# Patient Record
Sex: Male | Born: 1955 | Race: White | Hispanic: No | Marital: Married | Smoking: Former smoker
Health system: Southern US, Community
[De-identification: ages and names within clinical notes are randomized; demographics above are authoritative.]

## PROBLEM LIST (undated history)

## (undated) DIAGNOSIS — C801 Malignant (primary) neoplasm, unspecified: Secondary | ICD-10-CM

---

## 2018-02-25 ENCOUNTER — Encounter (HOSPITAL_COMMUNITY): Payer: Self-pay

## 2018-02-25 ENCOUNTER — Emergency Department (HOSPITAL_COMMUNITY): Payer: Managed Care, Other (non HMO)

## 2018-02-25 ENCOUNTER — Emergency Department (HOSPITAL_COMMUNITY)
Admission: EM | Admit: 2018-02-25 | Discharge: 2018-02-25 | Disposition: A | Discharge status: Home / Self Care | Payer: Managed Care, Other (non HMO) | Attending: Emergency Medicine | Admitting: Emergency Medicine

## 2018-02-25 DIAGNOSIS — R079 Chest pain, unspecified: Secondary | ICD-10-CM | POA: Diagnosis present

## 2018-02-25 DIAGNOSIS — R0789 Other chest pain: Secondary | ICD-10-CM | POA: Diagnosis not present

## 2018-02-25 DIAGNOSIS — Z87891 Personal history of nicotine dependence: Secondary | ICD-10-CM | POA: Diagnosis not present

## 2018-02-25 DIAGNOSIS — R918 Other nonspecific abnormal finding of lung field: Secondary | ICD-10-CM | POA: Diagnosis not present

## 2018-02-25 LAB — D-DIMER, QUANTITATIVE: D-Dimer, Quant: 0.31 ug/mL-FEU (ref 0.00–0.50)

## 2018-02-25 LAB — CBC
HCT: 40.8 % (ref 39.0–52.0)
Hemoglobin: 13.4 g/dL (ref 13.0–17.0)
MCH: 29.1 pg (ref 26.0–34.0)
MCHC: 32.8 g/dL (ref 30.0–36.0)
MCV: 88.5 fL (ref 80.0–100.0)
Platelets: 235 10*3/uL (ref 150–400)
RBC: 4.61 MIL/uL (ref 4.22–5.81)
RDW: 12.8 % (ref 11.5–15.5)
WBC: 13 10*3/uL — ABNORMAL HIGH (ref 4.0–10.5)
nRBC: 0 % (ref 0.0–0.2)

## 2018-02-25 LAB — BASIC METABOLIC PANEL
Anion gap: 9 (ref 5–15)
BUN: 20 mg/dL (ref 8–23)
CO2: 22 mmol/L (ref 22–32)
Calcium: 8.9 mg/dL (ref 8.9–10.3)
Chloride: 110 mmol/L (ref 98–111)
Creatinine, Ser: 1.15 mg/dL (ref 0.61–1.24)
GFR calc Af Amer: >60 mL/min (ref >60)
GFR calc non Af Amer: >60 mL/min (ref >60)
Glucose, Bld: 136 mg/dL — ABNORMAL HIGH (ref 70–99)
Potassium: 3.4 mmol/L — ABNORMAL LOW (ref 3.5–5.1)
SODIUM: 141 mmol/L (ref 135–145)

## 2018-02-25 LAB — I-STAT TROPONIN, ED
Troponin i, poc: 0 ng/mL (ref 0.00–0.08)
Troponin i, poc: 0.01 ng/mL (ref 0.00–0.08)

## 2018-02-25 MED ORDER — NITROGLYCERIN 2 % TD OINT
1.0000 [in_us] | TOPICAL_OINTMENT | Freq: Once | TRANSDERMAL | Status: AC
  Administered 2018-02-25: 1 [in_us] via TOPICAL
  Filled 2018-02-25: qty 1

## 2018-02-25 MED ORDER — FENTANYL CITRATE (PF) 100 MCG/2ML IJ SOLN
50.0000 ug | Freq: Once | INTRAMUSCULAR | Status: AC
  Administered 2018-02-25: 50 ug via INTRAVENOUS
  Filled 2018-02-25: qty 2

## 2018-02-25 MED ORDER — IOPAMIDOL (ISOVUE-370) INJECTION 76%
INTRAVENOUS | Status: AC
  Filled 2018-02-25: qty 100

## 2018-02-25 MED ORDER — AMOXICILLIN-POT CLAVULANATE 875-125 MG PO TABS: 1.0000 | ORAL_TABLET | Freq: Two times a day (BID) | ORAL | 0 refills | Status: DC

## 2018-02-25 MED ORDER — IOPAMIDOL (ISOVUE-370) INJECTION 76%
100.0000 mL | Freq: Once | INTRAVENOUS | Status: AC | PRN
  Administered 2018-02-25: 100 mL via INTRAVENOUS

## 2018-02-25 MED ORDER — ASPIRIN 81 MG PO CHEW: 324.0000 mg | CHEWABLE_TABLET | Freq: Once | ORAL | Status: DC

## 2018-02-25 NOTE — ED Provider Notes (Signed)
8:54 AM Care transferred to me.  The patient CT scan does not show any evidence of PE or dissection.  He is feeling better.  CT scan does show evidence of granulomatous disease.  Patient states that years ago he had significant mold exposure and was told that he would always have abnormalities on x-rays or imaging of his lungs.  However I did discuss the masslike opacity seen.  While I discussed this could be benign, I also discussed the importance of getting the follow-up PET-CT scan in 4-6 weeks by his PCP.  He recently has had a respiratory illness and this could have an inflammatory/infectious component so he will be placed on Augmentin.  We discussed possible risks/side effects of antibiotics.  He otherwise appears stable for discharge home and we encouraged that he can return anytime if symptoms were to recur or worsen.  Results for orders placed or performed during the hospital encounter of 89/38/10  Basic metabolic panel  Result Value Ref Range   Sodium 141 135 - 145 mmol/L   Potassium 3.4 (L) 3.5 - 5.1 mmol/L   Chloride 110 98 - 111 mmol/L   CO2 22 22 - 32 mmol/L   Glucose, Bld 136 (H) 70 - 99 mg/dL   BUN 20 8 - 23 mg/dL   Creatinine, Ser 1.15 0.61 - 1.24 mg/dL   Calcium 8.9 8.9 - 10.3 mg/dL   GFR calc non Af Amer >60 >60 mL/min   GFR calc Af Amer >60 >60 mL/min   Anion gap 9 5 - 15  CBC  Result Value Ref Range   WBC 13.0 (H) 4.0 - 10.5 K/uL   RBC 4.61 4.22 - 5.81 MIL/uL   Hemoglobin 13.4 13.0 - 17.0 g/dL   HCT 40.8 39.0 - 52.0 %   MCV 88.5 80.0 - 100.0 fL   MCH 29.1 26.0 - 34.0 pg   MCHC 32.8 30.0 - 36.0 g/dL   RDW 12.8 11.5 - 15.5 %   Platelets 235 150 - 400 K/uL   nRBC 0.0 0.0 - 0.2 %  D-dimer, quantitative (not at [facility])  Result Value Ref Range   D-Dimer, Quant 0.31 0.00 - 0.50 ug/mL-FEU  I-stat troponin, ED  Result Value Ref Range   Troponin i, poc 0.00 0.00 - 0.08 ng/mL   Comment 3          I-Stat Troponin, ED (not at [facility])  Result Value Ref Range   Troponin i, poc  0.01 0.00 - 0.08 ng/mL   Comment 3           Dg Chest 2 View  Result Date: 02/25/2018 CLINICAL DATA:  62 y/o  M; chest pain radiating to the back. EXAM: CHEST - 2 VIEW COMPARISON:  None. FINDINGS: The heart size and mediastinal contours are within normal limits. Calcified granulomas in the lung apices. Pulmonary venous hypertension. No consolidation, effusion, or pneumothorax. No acute osseous abnormality is evident. IMPRESSION: Pulmonary venous hypertension.  No focal consolidation. Electronically Signed   By: Kristine Garbe M.D.   On: 02/25/2018 04:23   Ct Angio Chest/abd/pel For Dissection W And/or Wo Contrast  Result Date: 02/25/2018 CLINICAL DATA:  61 year old male with chest pain beginning earlier this morning. EXAM: CT ANGIOGRAPHY CHEST, ABDOMEN AND PELVIS TECHNIQUE: Multidetector CT imaging through the chest, abdomen and pelvis was performed using the standard protocol during bolus administration of intravenous contrast. Multiplanar reconstructed images and MIPs were obtained and reviewed to evaluate the vascular anatomy. CONTRAST:  179mL ISOVUE-370 IOPAMIDOL (ISOVUE-370) INJECTION  76% COMPARISON:  Chest x-ray obtained earlier today FINDINGS: CTA CHEST FINDINGS Cardiovascular: Excellent opacification of the thoracic aorta and branch vessels. Two vessel aortic arch anatomy. The right brachiocephalic and left common carotid artery share a common origin. No evidence of aneurysm, dissection or significant atherosclerotic plaque. The main pulmonary artery is normal in size and also well opacified. No evidence of pulmonary embolus. The heart is normal in size. No pericardial effusion. Mediastinum/Nodes: Positive for calcified and noncalcified mediastinal lymphadenopathy. An index right paratracheal node measures 1.7 cm in short axis. An index low right paratracheal lymph node measures 1.7 cm in short axis. Numerous calcified lymph nodes are present in both hilar nodal stations. Thoracic  esophagus is unremarkable. Lungs/Pleura: Macrolobulated soft tissue mass in the posterior aspect of the right lower lobe contains punctate internal calcifications and measures approximately 4.4 x 3.1 x 4.0 cm. There is adjacent patchy airspace opacity which may represent atelectasis, pneumonia or potentially postobstructive changes. Patchy airspace opacity also present within the posterior left lower lobe. There is an area of masslike consolidation measuring approximately 2.2 by 1.8 cm. Additional punctate calcified granulomas also present within the region. Numerous scattered bilateral calcified pulmonary nodules consistent with old granulomatous disease. Musculoskeletal: No acute fracture or aggressive appearing lytic or blastic osseous lesion. Review of the MIP images confirms the above findings. CTA ABDOMEN AND PELVIS FINDINGS VASCULAR Aorta: Normal caliber aorta without aneurysm, dissection, vasculitis or significant stenosis. Celiac: Patent without evidence of aneurysm, dissection, vasculitis or significant stenosis. SMA: Patent without evidence of aneurysm, dissection, vasculitis or significant stenosis. Renals: Both renal arteries are patent without evidence of aneurysm, dissection, vasculitis, fibromuscular dysplasia or significant stenosis. IMA: Patent without evidence of aneurysm, dissection, vasculitis or significant stenosis. Inflow: Patent without evidence of aneurysm, dissection, vasculitis or significant stenosis. Veins: No obvious venous abnormality within the limitations of this arterial phase study. Review of the MIP images confirms the above findings. NON-VASCULAR Hepatobiliary: Normal hepatic contour and morphology. No discrete hepatic lesions. Normal appearance of the gallbladder. No intra or extrahepatic biliary ductal dilatation. Pancreas: Unremarkable. No pancreatic ductal dilatation or surrounding inflammatory changes. Spleen: Punctate calcification consistent with sequelae of old  granulomatous disease. Adrenals/Urinary Tract: Adrenal glands are unremarkable. Kidneys are normal, without renal calculi, focal lesion, or hydronephrosis. Bladder is unremarkable. Stomach/Bowel: No evidence of obstruction or focal bowel wall thickening. Normal appendix in the right lower quadrant. The terminal ileum is unremarkable. Lymphatic: No suspicious lymphadenopathy in the abdomen or pelvis. Reproductive: Prostate is unremarkable. Other: No abdominal wall hernia or abnormality. No abdominopelvic ascites. Musculoskeletal: No acute or significant osseous findings. Review of the MIP images confirms the above findings. IMPRESSION: CTA CHEST 1. Negative for acute aortic dissection, central pulmonary embolus or other acute vascular abnormality. 2. Atypical and nonspecific bilateral lower lobe masslike opacities with internal dystrophic calcifications, mediastinal adenopathy and clear evidence of old granulomatous disease involving the lungs, spleen and bilateral hilar lymph nodes. Overall, the process is favored to be benign in nature and represent either advanced sequelae of old granulomatous disease, cryptogenic organizing pneumonia, atypical sarcoidosis or some combination of the above. Primary bronchogenic malignancy is considered less likely. Additionally, there is some superimposed patchy airspace opacification in both lower lobes in addition to the areas of masslike consolidation which may represent a superimposed acute infectious/inflammatory process such as pneumonia. Consider further evaluation with PET-CT in 4-6 weeks following an appropriate course of antibiotic therapy. CTA ABD/PELVIS 1. No acute vascular abnormality within the abdomen or pelvis. 2. Sequelae of old  granulomatous disease in the spleen. 3. Otherwise, unremarkable CTA of the abdomen and pelvis. Electronically Signed   By: Jacqulynn Cadet M.D.   On: 02/25/2018 08:37      Sherwood Gambler, MD 02/25/18 [phone]

## 2018-02-25 NOTE — ED Provider Notes (Signed)
[place] EMERGENCY DEPARTMENT Provider Note   CSN: [id number] Arrival date & time: 02/25/18  0334     History   Chief Complaint Chief Complaint  Patient presents with  . Chest Pain    HPI Thomas Wiley is a 62 y.o. male.  HPI  This is a 62 year old male who presents with chest pain.  Onset of chest pain 1 hour prior to arrival.  Patient states that he was at work when he developed sharp anterior chest pain that lasted for a few seconds.  He states that it starts out as an 8 and then settles into a 4 on the pain scale.  He states that it settles into pressure-like pain.  Currently he is endorsing 4 out of 10 pain.  Denies any coughs, shortness of breath, fever.  Denies any history of coronary artery disease.  He is a former smoker.  Denies any history of hypertension, hyperlipidemia, diabetes.  Nothing seems to make the pain better or worse.  History reviewed. No pertinent past medical history.  There are no active problems to display for this patient.   History reviewed. No pertinent surgical history.      Home Medications    Prior to Admission medications   Not on File    Family History History reviewed. No pertinent family history.  Social History Social History   Tobacco Use  . Smoking status: Former Research scientist (life sciences)  . Smokeless tobacco: Never Used  Substance Use Topics  . Alcohol use: Yes    Comment: occ  . Drug use: Never     Allergies   Patient has no allergy information on record.   Review of Systems Review of Systems  Constitutional: Negative for fever.  Respiratory: Negative for cough and shortness of breath.   Cardiovascular: Positive for chest pain. Negative for leg swelling.  Gastrointestinal: Negative for abdominal pain, nausea and vomiting.  Genitourinary: Negative for dysuria.  All other systems reviewed and are negative.    Physical Exam Updated Vital Signs BP 105/75   Pulse (!) 58   Temp 97.7 F (36.5 C) (Oral)    Resp 11   Ht 1.676 m (5\' 6" )   Wt 81.6 kg   SpO2 97%   BMI 29.05 kg/m   Physical Exam  Constitutional: He is oriented to person, place, and time. He appears well-developed and well-nourished. No distress.  HENT:  Head: Normocephalic and atraumatic.  Neck: Neck supple.  Cardiovascular: Normal rate, regular rhythm and normal heart sounds.  No murmur heard. Pulmonary/Chest: Effort normal and breath sounds normal. No respiratory distress. He has no wheezes.  Abdominal: Soft. Bowel sounds are normal. There is no tenderness. There is no rebound.  Musculoskeletal: He exhibits no edema.       Right lower leg: He exhibits no edema.       Left lower leg: He exhibits no edema.  Lymphadenopathy:    He has no cervical adenopathy.  Neurological: He is alert and oriented to person, place, and time.  Skin: Skin is warm and dry.  Psychiatric: He has a normal mood and affect.  Nursing note and vitals reviewed.    ED Treatments / Results  Labs (all labs ordered are listed, but only abnormal results are displayed) Labs Reviewed  BASIC METABOLIC PANEL - Abnormal; Notable for the following components:      Result Value   Potassium 3.4 (*)    Glucose, Bld 136 (*)    All other components within normal limits  CBC - Abnormal; Notable for the following components:   WBC 13.0 (*)    All other components within normal limits  D-DIMER, QUANTITATIVE (NOT AT [facility])  I-STAT TROPONIN, ED  I-STAT TROPONIN, ED    EKG EKG Interpretation  Date/Time:  Wednesday February 25 2018 03:35:34 EST Ventricular Rate:  47 PR Interval:    QRS Duration: 103 QT Interval:  421 QTC Calculation: 373 R Axis:   88 Text Interpretation:  Sinus bradycardia Borderline right axis deviation No prior for comparison Confirmed by Thayer Jew [id number]) on 02/25/2018 3:41:47 AM Also confirmed by Thayer Jew [phone]), editor Hattie Perch ([id number])  on 02/25/2018 6:45:49 AM   Radiology Dg Chest 2 View  Result  Date: 02/25/2018 CLINICAL DATA:  62 y/o  M; chest pain radiating to the back. EXAM: CHEST - 2 VIEW COMPARISON:  None. FINDINGS: The heart size and mediastinal contours are within normal limits. Calcified granulomas in the lung apices. Pulmonary venous hypertension. No consolidation, effusion, or pneumothorax. No acute osseous abnormality is evident. IMPRESSION: Pulmonary venous hypertension.  No focal consolidation. Electronically Signed   By: Kristine Garbe M.D.   On: 02/25/2018 04:23    Procedures Procedures (including critical care time)  Medications Ordered in ED Medications  aspirin chewable tablet 324 mg (324 mg Oral Not Given 02/25/18 0350)  nitroGLYCERIN (NITROGLYN) 2 % ointment 1 inch (1 inch Topical Given 02/25/18 0425)  fentaNYL (SUBLIMAZE) injection 50 mcg (50 mcg Intravenous Given 02/25/18 0512)     Initial Impression / Assessment and Plan / ED Course  I have reviewed the triage vital signs and the nursing notes.  Pertinent labs & imaging results that were available during my care of the patient were reviewed by me and considered in my medical decision making (see chart for details).  Clinical Course as of Feb 25 722  Wed Feb 25, 2018  0350 Patient initially did not endorse any radiation to the back.  However, on recheck he is much more comfortable after pain medication but states that he still has some discomfort that radiates to his back.  Will obtain CT scan to rule out dissection.   [CH]    Clinical Course User Index [CH] Jaran Sainz, Barbette Hair, MD    Patient presents with chest pain.  He is overall nontoxic-appearing and vital signs are reassuring.  Pain description is fairly atypical.  He denies exertional symptoms or pleuritic symptoms.  He denies radiation; although on review of nursing notes he indicated radiation to them.  Initial work-up with troponin x2, d-dimer is negative.  He has no evidence of acute ischemia on EKG.  Chest x-ray shows no evidence of  pneumothorax or pneumonia.  He does not have mediastinal widening.  However, with persistent radiation of pain to the back, will obtain a CT scan to rule out dissection.  Final Clinical Impressions(s) / ED Diagnoses   Final diagnoses:  Atypical chest pain    ED Discharge Orders    None       Joeanna Howdyshell, Barbette Hair, MD 02/25/18 [phone]

## 2018-02-25 NOTE — Discharge Instructions (Addendum)
You were seen today for chest pain.  Your work-up is largely reassuring.  You need to follow-up with cardiology for stress testing.  If you have any new or worsening symptoms you should be reevaluated.  Your CT scan shows areas of granulomas that appear old. There is also a mass-like opacities that are not clearly defined. Pneumonia could be contributing. However it is very important to get a PET-CT scan in about 4-6 weeks to ensure these improve or are not worsening.

## 2018-02-25 NOTE — ED Triage Notes (Signed)
Pt was at work about an hour ago and started to have centeralized chest pain that radiates to his back. Pt was given 324 asa and fentlyn. Pt was not given nitro due to pt blood pressure being so low 80/60 and when he layed down bp went up to 100/60. Pt has hx of low heart rate.

## 2019-01-26 IMAGING — DX DG CHEST 2V
2 series · 2 of 2 positions shown · non-contrast
Comparison: None.

CLINICAL DATA: 62 y/o  M; chest pain radiating to the back.

EXAM:
CHEST - 2 VIEW

[chest pa]
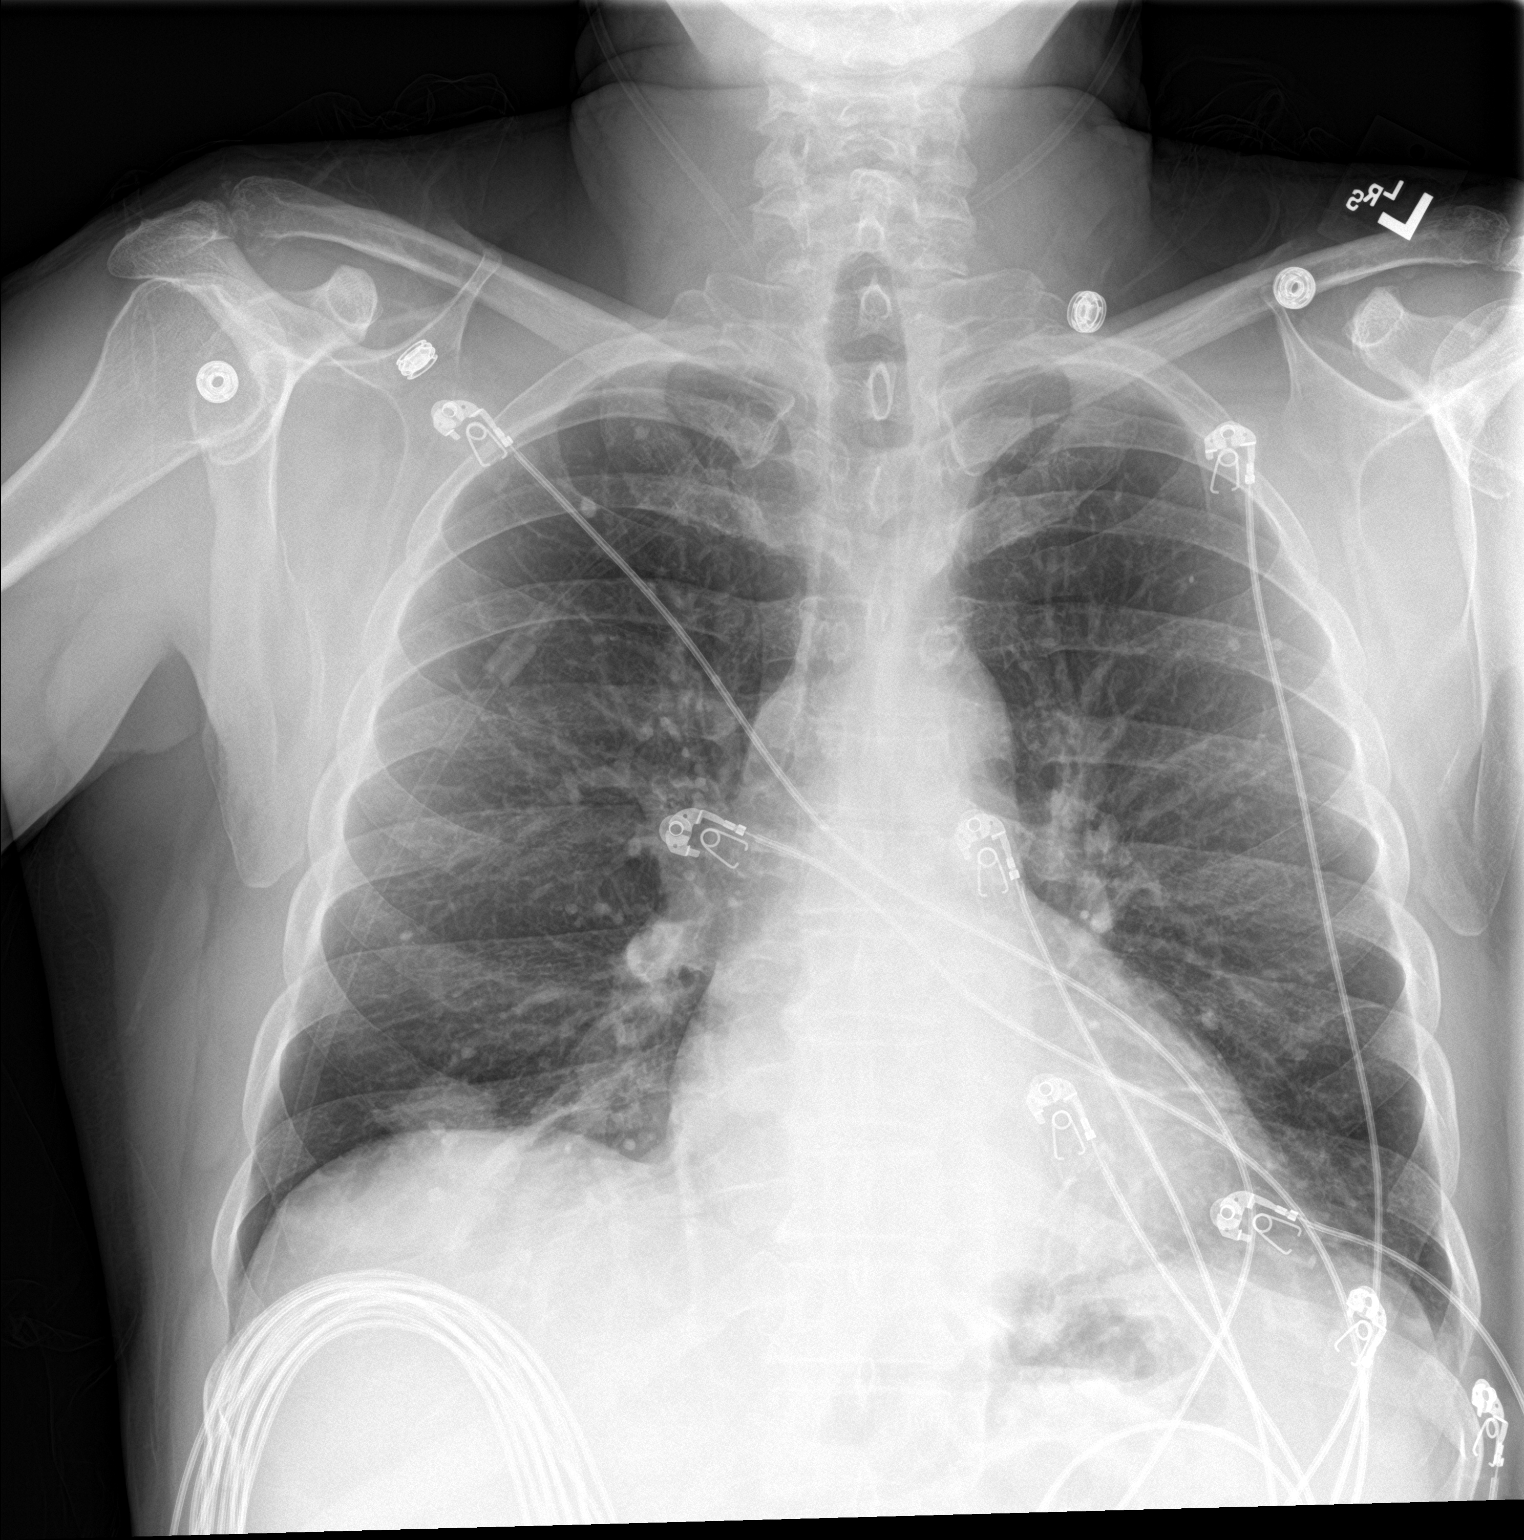

[chest lat]
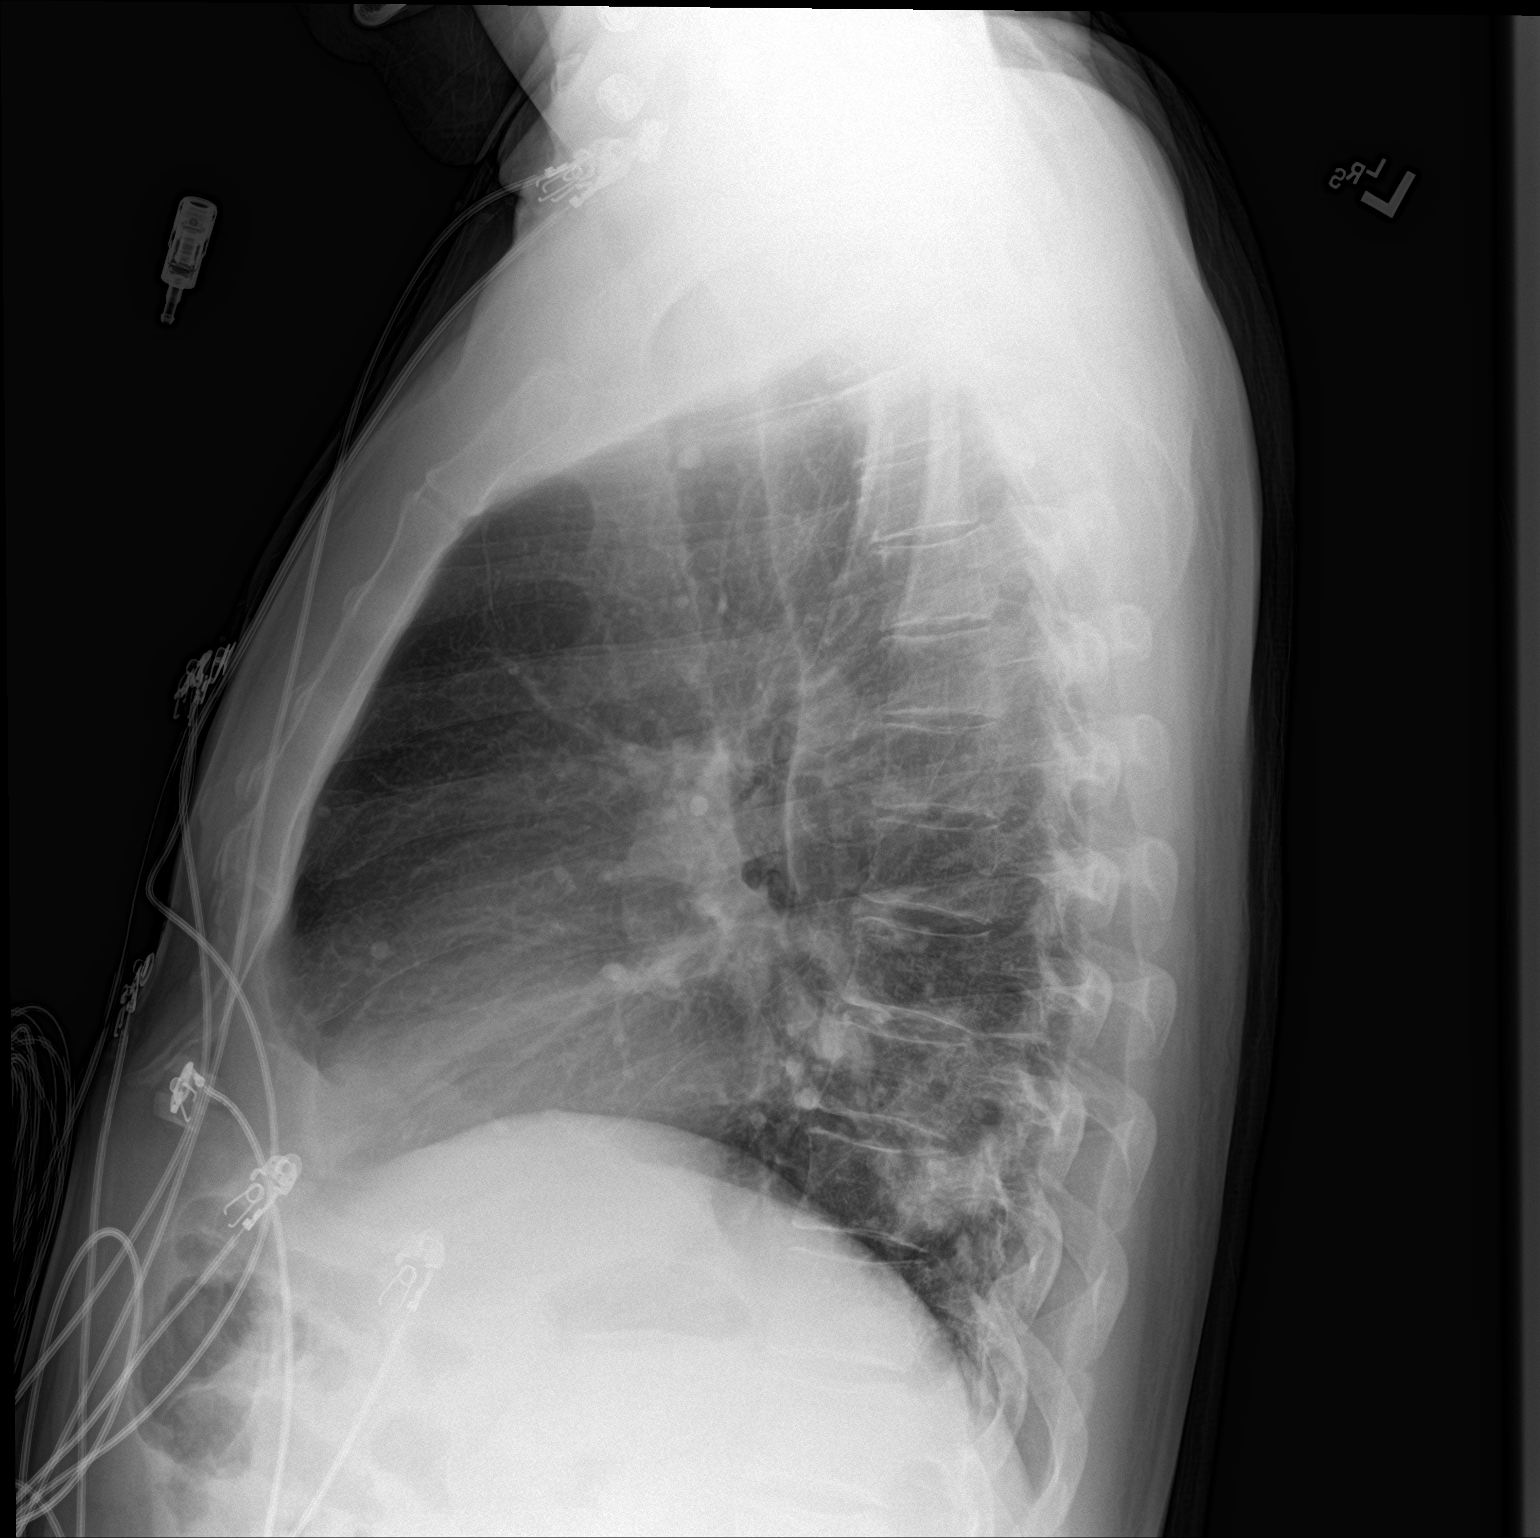

[2 of 2 positions shown; findings below may reference images not displayed]

FINDINGS: The heart size and mediastinal contours are within normal limits.
Calcified granulomas in the lung apices. Pulmonary venous
hypertension. No consolidation, effusion, or pneumothorax. No acute
osseous abnormality is evident.
IMPRESSION: Pulmonary venous hypertension.  No focal consolidation.

## 2019-01-26 IMAGING — CT CT ANGIO CHEST-ABD-PELV FOR DISSECTION W/ AND WO/W CM
2 of 7 series · 13 of 46 positions shown, 15 images · IV contrast (iopamidol)
Comparison: Chest x-ray obtained earlier today

CLINICAL DATA: 65-year-old male with chest pain beginning earlier
this morning.

EXAM:
CT ANGIOGRAPHY CHEST, ABDOMEN AND PELVIS
TECHNIQUE: Multidetector CT imaging through the chest, abdomen and pelvis was
performed using the standard protocol during bolus administration of
intravenous contrast. Multiplanar reconstructed images and MIPs were
obtained and reviewed to evaluate the vascular anatomy.
CONTRAST:  100mL BUE3VK-9RY IOPAMIDOL (BUE3VK-9RY) INJECTION 76%

[Series 7: dissection 2mm · axial · 0.84mm/px · z∈[+819,+1423]mm · 10 of 338 slices shown, 12 images]
[im 18/338  soft-tissue]
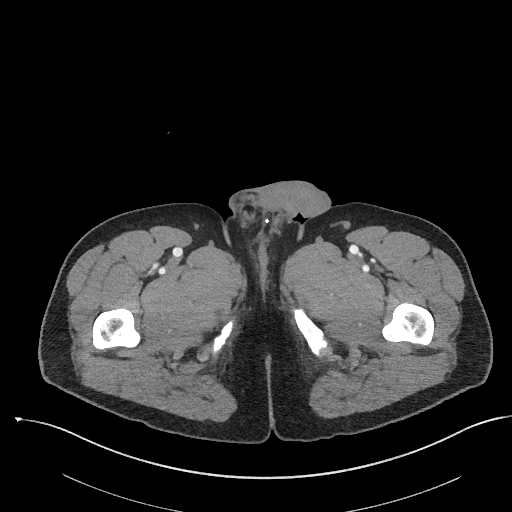
[im 18/338  bone]
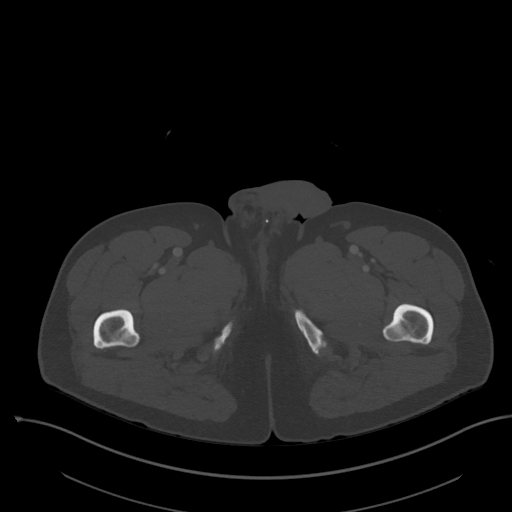
[im 54/338  soft-tissue]
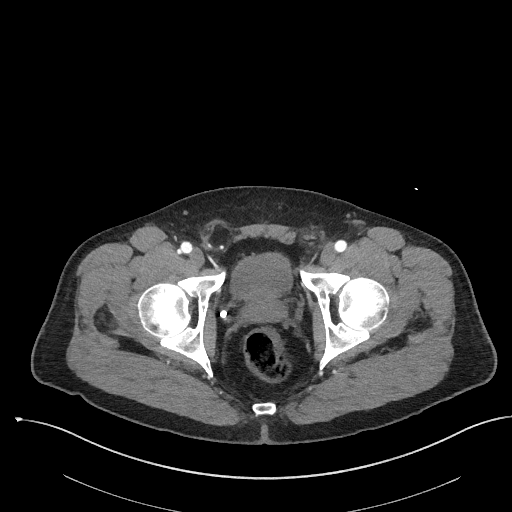
[im 89/338  soft-tissue]
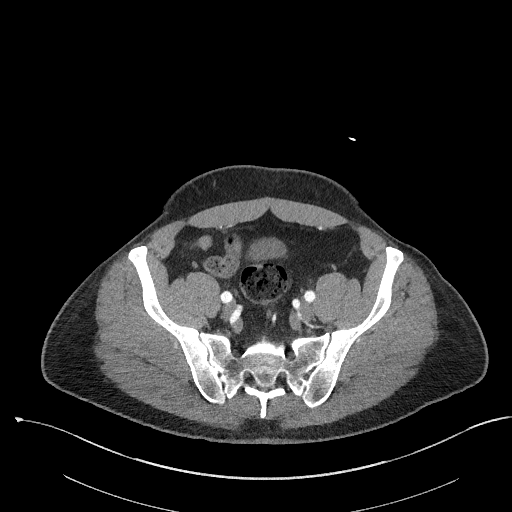
[im 125/338  soft-tissue]
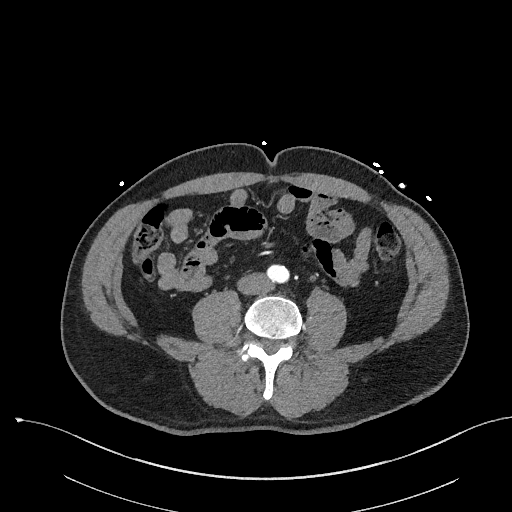
[im 160/338  soft-tissue]
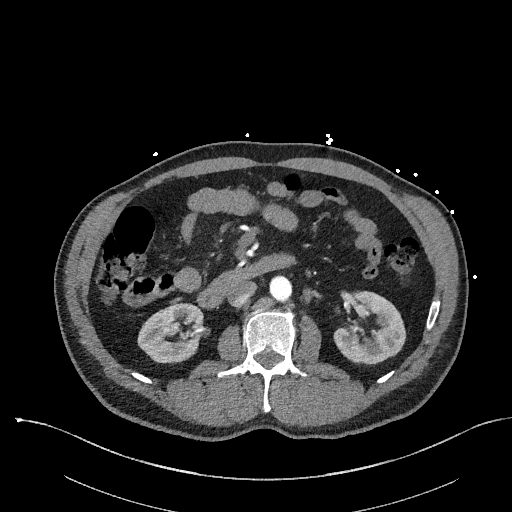
[im 178/338  soft-tissue]
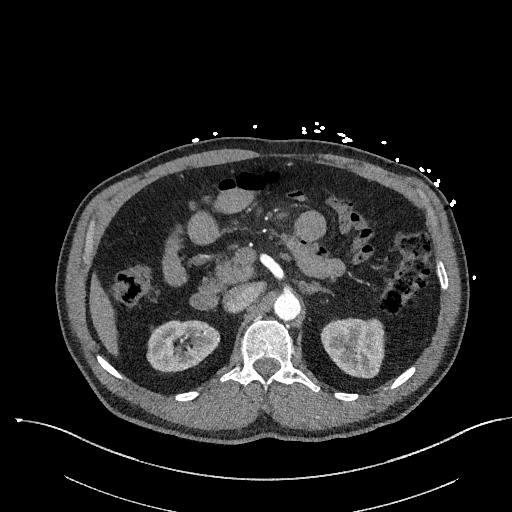
[im 213/338  soft-tissue]
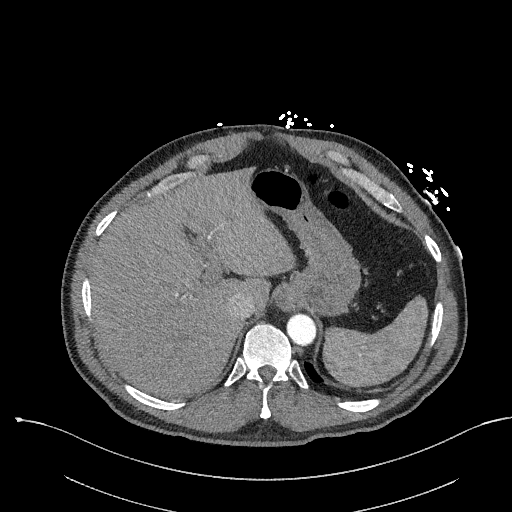
[im 249/338  soft-tissue]
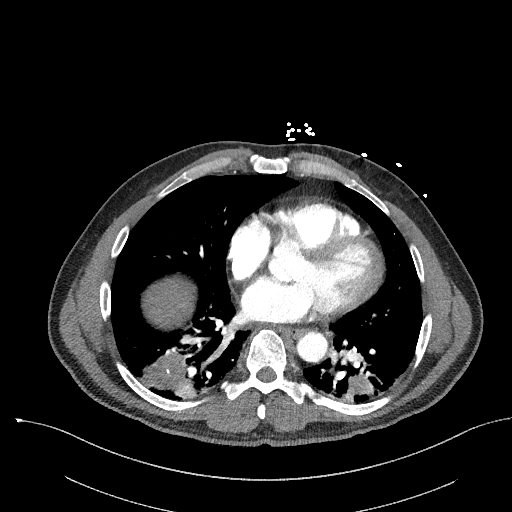
[im 284/338  soft-tissue]
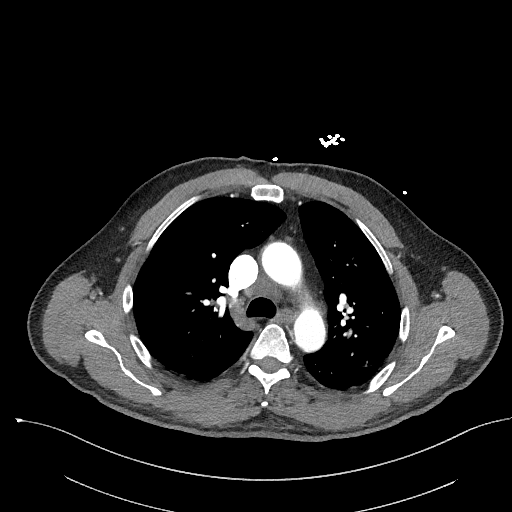
[im 284/338  bone]
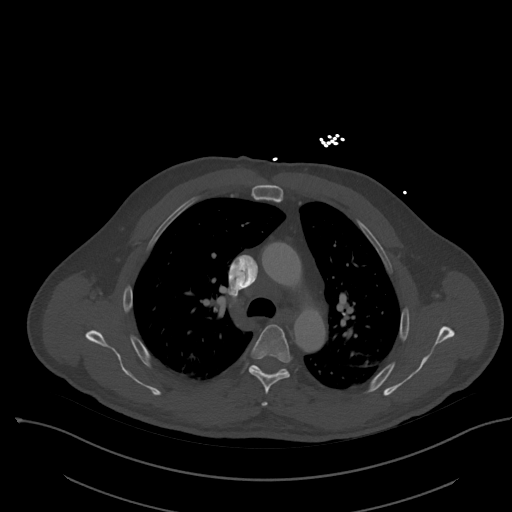
[im 320/338  soft-tissue]
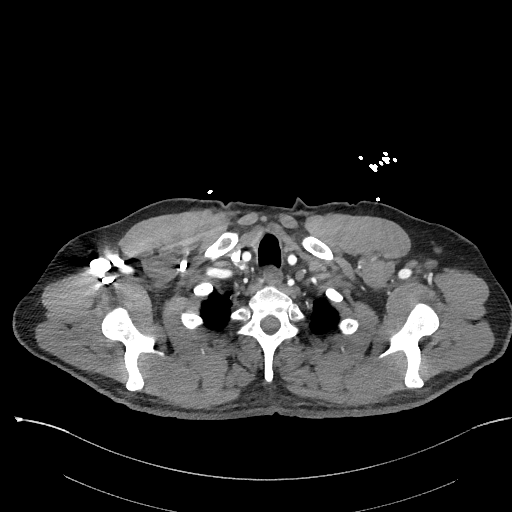

[Series 10: dissection 2mm cor · coronal · 1.11mm/px · 3 of 178 slices shown]
[im 45/178  soft-tissue]
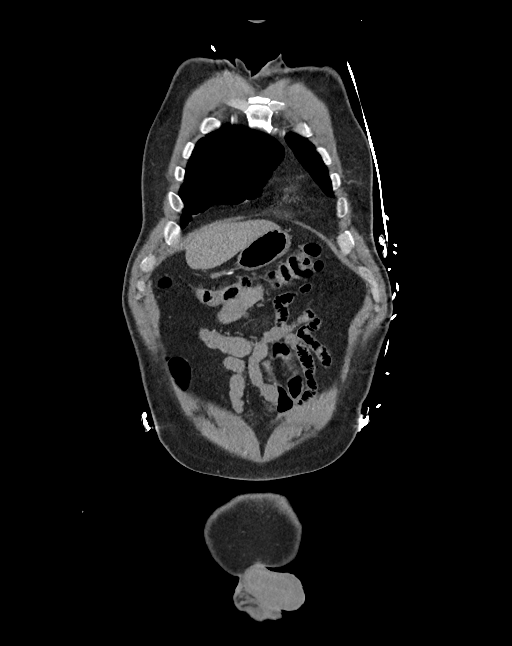
[im 89/178  soft-tissue]
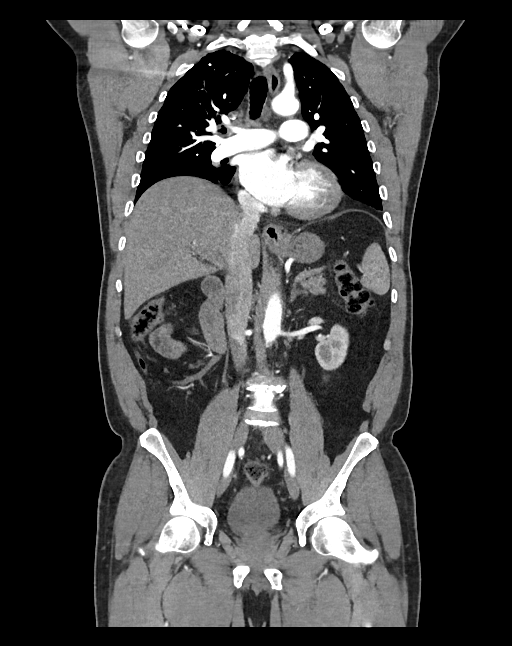
[im 133/178  soft-tissue]
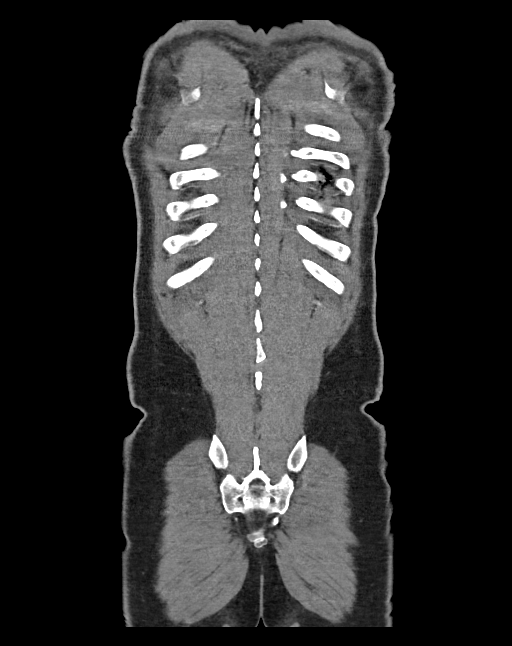

[13 of 46 positions shown; findings below may reference images not displayed]

FINDINGS: CTA CHEST FINDINGS

Cardiovascular: Excellent opacification of the thoracic aorta and
branch vessels. Two vessel aortic arch anatomy. The right
brachiocephalic and left common carotid artery share a common
origin. No evidence of aneurysm, dissection or significant
atherosclerotic plaque. The main pulmonary artery is normal in size
and also well opacified. No evidence of pulmonary embolus. The heart
is normal in size. No pericardial effusion.

Mediastinum/Nodes: Positive for calcified and noncalcified
mediastinal lymphadenopathy. An index right paratracheal node
measures 1.7 cm in short axis. An index low right paratracheal lymph
node measures 1.7 cm in short axis. Numerous calcified lymph nodes
are present in both hilar nodal stations. Thoracic esophagus is
unremarkable.

Lungs/Pleura: Macrolobulated soft tissue mass in the posterior
aspect of the right lower lobe contains punctate internal
calcifications and measures approximately 4.4 x 3.1 x 4.0 cm. There
is adjacent patchy airspace opacity which may represent atelectasis,
pneumonia or potentially postobstructive changes. Patchy airspace
opacity also present within the posterior left lower lobe. There is
an area of masslike consolidation measuring approximately 2.2 by
cm. Additional punctate calcified granulomas also present within the
region. Numerous scattered bilateral calcified pulmonary nodules
consistent with old granulomatous disease.

Musculoskeletal: No acute fracture or aggressive appearing lytic or
blastic osseous lesion.

Review of the MIP images confirms the above findings.

CTA ABDOMEN AND PELVIS FINDINGS

VASCULAR

Aorta: Normal caliber aorta without aneurysm, dissection, vasculitis
or significant stenosis.

Celiac: Patent without evidence of aneurysm, dissection, vasculitis
or significant stenosis.

SMA: Patent without evidence of aneurysm, dissection, vasculitis or
significant stenosis.

Renals: Both renal arteries are patent without evidence of aneurysm,
dissection, vasculitis, fibromuscular dysplasia or significant
stenosis.

IMA: Patent without evidence of aneurysm, dissection, vasculitis or
significant stenosis.

Inflow: Patent without evidence of aneurysm, dissection, vasculitis
or significant stenosis.

Veins: No obvious venous abnormality within the limitations of this
arterial phase study.

Review of the MIP images confirms the above findings.

NON-VASCULAR

Hepatobiliary: Normal hepatic contour and morphology. No discrete
hepatic lesions. Normal appearance of the gallbladder. No intra or
extrahepatic biliary ductal dilatation.

Pancreas: Unremarkable. No pancreatic ductal dilatation or
surrounding inflammatory changes.

Spleen: Punctate calcification consistent with sequelae of old
granulomatous disease.

Adrenals/Urinary Tract: Adrenal glands are unremarkable. Kidneys are
normal, without renal calculi, focal lesion, or hydronephrosis.
Bladder is unremarkable.

Stomach/Bowel: No evidence of obstruction or focal bowel wall
thickening. Normal appendix in the right lower quadrant. The
terminal ileum is unremarkable.

Lymphatic: No suspicious lymphadenopathy in the abdomen or pelvis.

Reproductive: Prostate is unremarkable.

Other: No abdominal wall hernia or abnormality. No abdominopelvic
ascites.

Musculoskeletal: No acute or significant osseous findings.

Review of the MIP images confirms the above findings.
IMPRESSION: CTA CHEST

1. Negative for acute aortic dissection, central pulmonary embolus
or other acute vascular abnormality.
2. Atypical and nonspecific bilateral lower lobe masslike opacities
with internal dystrophic calcifications, mediastinal adenopathy and
clear evidence of old granulomatous disease involving the lungs,
spleen and bilateral hilar lymph nodes. Overall, the process is
favored to be benign in nature and represent either advanced
sequelae of old granulomatous disease, cryptogenic organizing
pneumonia, atypical sarcoidosis or some combination of the above.
Primary bronchogenic malignancy is considered less likely.
Additionally, there is some superimposed patchy airspace
opacification in both lower lobes in addition to the areas of
masslike consolidation which may represent a superimposed acute
infectious/inflammatory process such as pneumonia. Consider further
evaluation with PET-CT in 4-6 weeks following an appropriate course
of antibiotic therapy.

CTA ABD/PELVIS

1. No acute vascular abnormality within the abdomen or pelvis.
2. Sequelae of old granulomatous disease in the spleen.
3. Otherwise, unremarkable CTA of the abdomen and pelvis.

## 2020-04-26 ENCOUNTER — Emergency Department
Admission: EM | Admit: 2020-04-26 | Discharge: 2020-04-26 | Disposition: A | Discharge status: Home / Self Care | Payer: No Typology Code available for payment source | Attending: Emergency Medicine | Admitting: Emergency Medicine

## 2020-04-26 ENCOUNTER — Other Ambulatory Visit: Payer: Self-pay

## 2020-04-26 ENCOUNTER — Emergency Department (HOSPITAL_COMMUNITY)
Admission: EM | Admit: 2020-04-26 | Discharge: 2020-04-26 | Disposition: A | Discharge status: Home / Self Care | Payer: No Typology Code available for payment source | Attending: Emergency Medicine | Admitting: Emergency Medicine

## 2020-04-26 ENCOUNTER — Encounter (HOSPITAL_COMMUNITY): Payer: Self-pay

## 2020-04-26 ENCOUNTER — Encounter: Payer: Self-pay | Admitting: *Deleted

## 2020-04-26 DIAGNOSIS — S0591XA Unspecified injury of right eye and orbit, initial encounter: Secondary | ICD-10-CM | POA: Diagnosis present

## 2020-04-26 DIAGNOSIS — Z87891 Personal history of nicotine dependence: Secondary | ICD-10-CM | POA: Diagnosis not present

## 2020-04-26 DIAGNOSIS — T542X1A Toxic effect of corrosive acids and acid-like substances, accidental (unintentional), initial encounter: Secondary | ICD-10-CM | POA: Diagnosis present

## 2020-04-26 DIAGNOSIS — T2611XA Burn of cornea and conjunctival sac, right eye, initial encounter: Secondary | ICD-10-CM | POA: Diagnosis not present

## 2020-04-26 DIAGNOSIS — X19XXXA Contact with other heat and hot substances, initial encounter: Secondary | ICD-10-CM | POA: Insufficient documentation

## 2020-04-26 DIAGNOSIS — Z5321 Procedure and treatment not carried out due to patient leaving prior to being seen by health care provider: Secondary | ICD-10-CM | POA: Diagnosis not present

## 2020-04-26 DIAGNOSIS — Y990 Civilian activity done for income or pay: Secondary | ICD-10-CM | POA: Diagnosis not present

## 2020-04-26 DIAGNOSIS — T2610XA Burn of cornea and conjunctival sac, unspecified eye, initial encounter: Secondary | ICD-10-CM

## 2020-04-26 HISTORY — DX: Malignant (primary) neoplasm, unspecified: C80.1

## 2020-04-26 MED ORDER — ERYTHROMYCIN 5 MG/GM OP OINT: 1.0000 "application " | TOPICAL_OINTMENT | Freq: Three times a day (TID) | OPHTHALMIC | 0 refills | Status: DC

## 2020-04-26 MED ORDER — TETANUS-DIPHTH-ACELL PERTUSSIS 5-2.5-18.5 LF-MCG/0.5 IM SUSY: 0.5000 mL | PREFILLED_SYRINGE | Freq: Once | INTRAMUSCULAR | Status: DC

## 2020-04-26 MED ORDER — TETRACAINE HCL 0.5 % OP SOLN
2.0000 [drp] | Freq: Once | OPHTHALMIC | Status: AC
  Administered 2020-04-26: 2 [drp] via OPHTHALMIC
  Filled 2020-04-26: qty 4

## 2020-04-26 MED ORDER — HYDROCODONE-ACETAMINOPHEN 5-325 MG PO TABS: 1.0000 | ORAL_TABLET | Freq: Four times a day (QID) | ORAL | 0 refills | Status: AC | PRN

## 2020-04-26 MED ORDER — TETRACAINE HCL 0.5 % OP SOLN: 2.0000 [drp] | Freq: Once | OPHTHALMIC | Status: DC

## 2020-04-26 MED ORDER — FLUORESCEIN SODIUM 1 MG OP STRP: 1.0000 | ORAL_STRIP | Freq: Once | OPHTHALMIC | Status: DC

## 2020-04-26 MED ORDER — HYDROCODONE-ACETAMINOPHEN 5-325 MG PO TABS: 1.0000 | ORAL_TABLET | Freq: Four times a day (QID) | ORAL | 0 refills | Status: DC | PRN

## 2020-04-26 MED ORDER — ERYTHROMYCIN 5 MG/GM OP OINT: 1.0000 "application " | TOPICAL_OINTMENT | Freq: Three times a day (TID) | OPHTHALMIC | 0 refills | Status: AC

## 2020-04-26 MED ORDER — ERYTHROMYCIN 5 MG/GM OP OINT
TOPICAL_OINTMENT | Freq: Once | OPHTHALMIC | Status: AC
  Administered 2020-04-26: 1 via OPHTHALMIC
  Filled 2020-04-26: qty 1

## 2020-04-26 NOTE — ED Triage Notes (Signed)
Pt reports that he was working and got battery acid in his R eye, pt flushed eye, pt eye very red.

## 2020-04-26 NOTE — ED Notes (Signed)
Pt states they are leaving  

## 2020-04-26 NOTE — ED Provider Notes (Signed)
[facility] Emergency Department Provider Note  ____________________________________________   Event Date/Time   First MD Initiated Contact with Patient 04/26/20 [phone]     (approximate)  I have reviewed the triage vital signs and the nursing notes.   HISTORY  Chief Complaint Eye Injury    HPI Thomas Wiley is a 65 y.o. male here with eye pain.  The patient states that around 3 PM this afternoon, he was at work, when battery acid crystals fell into his eye.  He reports that he had immediate onset of sharp, stabbing, burning pain.  He immediately went to [place]) I under high-pressure water for 5 minutes.  He states that since then, he has had persistent, though not necessarily worsening, aching and burning eye pain.  Its worse with any kind movement of his eye or blinking.  He states he has had increased eye redness.  He does not believe his vision has been impaired at all.  No history of similar issues.  He is not sure when his last tetanus shot was.  No other complaints.        Past Medical History:  Diagnosis Date  . Cancer ([place])    colon, lung    There are no problems to display for this patient.   History reviewed. No pertinent surgical history.  Prior to Admission medications   Medication Sig Start Date End Date Taking? Authorizing Provider  amoxicillin-clavulanate (AUGMENTIN) 875-125 MG tablet Take 1 tablet by mouth 2 (two) times daily. One po bid x 7 days 02/25/18   Sherwood Gambler, MD  erythromycin ophthalmic ointment Place 1 application into the right eye 3 (three) times daily for 5 days. 04/26/20 05/01/20  Duffy Bruce, MD  HYDROcodone-acetaminophen (NORCO/VICODIN) 5-325 MG tablet Take 1 tablet by mouth every 6 (six) hours as needed for severe pain. 04/26/20 04/26/21  Duffy Bruce, MD    Allergies Cymbalta [duloxetine hcl]  History reviewed. No pertinent family history.  Social History Social History   Tobacco Use  . Smoking  status: Former Research scientist (life sciences)  . Smokeless tobacco: Never Used  Substance Use Topics  . Alcohol use: Yes    Comment: occ  . Drug use: Never    Review of Systems  Review of Systems  Constitutional: Negative for chills and fever.  HENT: Negative for sore throat.   Eyes: Positive for pain and redness.  Respiratory: Negative for shortness of breath.   Cardiovascular: Negative for chest pain.  Gastrointestinal: Negative for abdominal pain.  Genitourinary: Negative for flank pain.  Musculoskeletal: Negative for neck pain.  Skin: Negative for rash and wound.  Allergic/Immunologic: Negative for immunocompromised state.  Neurological: Negative for weakness and numbness.  Hematological: Does not bruise/bleed easily.  All other systems reviewed and are negative.    ____________________________________________  PHYSICAL EXAM:      VITAL SIGNS: ED Triage Vitals  Enc Vitals Group     BP 04/26/20 0427 (!) 129/98     Pulse Rate 04/26/20 0427 (!) 52     Resp 04/26/20 0427 16     Temp 04/26/20 0427 97.6 F (36.4 C)     Temp Source 04/26/20 0427 Oral     SpO2 04/26/20 0427 98 %     Weight 04/26/20 0428 179 lb 14.3 oz (81.6 kg)     Height 04/26/20 0428 5\' 6"  (1.676 m)     Head Circumference --      Peak Flow --      Pain Score --  Pain Loc --      Pain Edu? --      Excl. in [place]? --      Physical Exam Vitals and nursing note reviewed.  Constitutional:      General: He is not in acute distress.    Appearance: He is well-developed and well-nourished.  HENT:     Head: Normocephalic and atraumatic.  Eyes:     Comments: Diffuse conjunctival injection and mild chemosis of the right eye.  Pupils are equal and reactive.  No foreign bodies appreciated.  Negative Seidel's with no sign of rupture.  Diffuse corneal irritation without apparent ulcer.  Cardiovascular:     Rate and Rhythm: Normal rate and regular rhythm.     Heart sounds: Normal heart sounds.  Pulmonary:     Effort: Pulmonary  effort is normal. No respiratory distress.     Breath sounds: No wheezing.  Abdominal:     General: There is no distension.  Musculoskeletal:        General: No edema.     Cervical back: Neck supple.  Skin:    General: Skin is warm.     Capillary Refill: Capillary refill takes less than 2 seconds.     Findings: No rash.  Neurological:     Mental Status: He is alert and oriented to person, place, and time.     Motor: No abnormal muscle tone.       ____________________________________________   LABS (all labs ordered are listed, but only abnormal results are displayed)  Labs Reviewed - No data to display  ____________________________________________  EKG: ________________________________________  RADIOLOGY All imaging, including plain films, CT scans, and ultrasounds, independently reviewed by me, and interpretations confirmed via formal radiology reads.  ED MD interpretation:     Official radiology report(s): No results found.  ____________________________________________  PROCEDURES   Procedure(s) performed (including Critical Care):  Procedures  ____________________________________________  INITIAL IMPRESSION / MDM / [place] / ED COURSE  As part of my medical decision making, I reviewed the following data within the [place] notes reviewed and incorporated, Old chart reviewed, Notes from prior ED visits, and [place] Controlled Substance Database       *Thomas Wiley was evaluated in Emergency Department on 04/26/2020 for the symptoms described in the history of present illness. He was evaluated in the context of the global COVID-19 pandemic, which necessitated consideration that the patient might be at risk for infection with the SARS-CoV-2 virus that causes COVID-19. Institutional protocols and algorithms that pertain to the evaluation of patients at risk for COVID-19 are in a state of rapid change based on information  released by regulatory bodies including the CDC and federal and state organizations. These policies and algorithms were followed during the patient's care in the ED.  Some ED evaluations and interventions may be delayed as a result of limited staffing during the pandemic.*     Medical Decision Making: 65 year old male here with right eye pain after acid exposure.  The patient irrigated his eye thoroughly after the injury.  Here, he has evidence of diffuse corneal injection and irritation without evidence of ulceration or perforation.  Globe is intact.  The eye was thoroughly irrigated with an additional 1 L of normal saline and posterior [place] pH was 7 using litmus paper.  Will place the patient on prophylactic antibiotics and send for close 24-48 hour ophtho follow-up.  ____________________________________________  FINAL CLINICAL IMPRESSION(S) / ED DIAGNOSES  Final diagnoses:  Corneal  burn, initial encounter     MEDICATIONS GIVEN DURING THIS VISIT:  Medications  erythromycin ophthalmic ointment (has no administration in time range)  Tdap (BOOSTRIX) injection 0.5 mL (has no administration in time range)  tetracaine (PONTOCAINE) 0.5 % ophthalmic solution 2 drop (2 drops Right Eye Given by Other 04/26/20 0448)     ED Discharge Orders         Ordered    erythromycin ophthalmic ointment  3 times daily,   Status:  Discontinued        04/26/20 0539    HYDROcodone-acetaminophen (NORCO/VICODIN) 5-325 MG tablet  Every 6 hours PRN,   Status:  Discontinued        04/26/20 0539    erythromycin ophthalmic ointment  3 times daily,   Status:  Discontinued        04/26/20 0539    HYDROcodone-acetaminophen (NORCO/VICODIN) 5-325 MG tablet  Every 6 hours PRN,   Status:  Discontinued        04/26/20 0539    erythromycin ophthalmic ointment  3 times daily        04/26/20 0540    HYDROcodone-acetaminophen (NORCO/VICODIN) 5-325 MG tablet  Every 6 hours PRN        04/26/20 0540           Note:   This document was prepared using Dragon voice recognition software and may include unintentional dictation errors.   Duffy Bruce, MD 04/26/20 [phone]

## 2020-04-26 NOTE — ED Triage Notes (Signed)
Pt to ED from [place] reporting exposure to crystallized battery acid at 3:00 this morning. Pt washed his own eye out for 5-10 minutes but reports feeling like something is stuck in his eye and the pain is worsening at this time. Headache also reported.   Pt describing it like he has a "film over my eye"

## 2020-04-26 NOTE — Discharge Instructions (Signed)
Call Dr. Melony Overly office to set up a follow-up in 24-48 hours for check-up after chemical burn of your eye  Use the erythromycin ointment to help prevent infection/keep the eye moist  Take tylenol and advil for pain as needed. I've prescribed a stronger pain medication if needed for the next 24 hours.

## 2022-01-28 ENCOUNTER — Emergency Department: Payer: No Typology Code available for payment source

## 2022-01-28 ENCOUNTER — Emergency Department
Admission: EM | Admit: 2022-01-28 | Discharge: 2022-01-28 | Disposition: A | Discharge status: Home / Self Care | Payer: No Typology Code available for payment source | Attending: Emergency Medicine | Admitting: Emergency Medicine

## 2022-01-28 ENCOUNTER — Other Ambulatory Visit: Payer: Self-pay

## 2022-01-28 DIAGNOSIS — R001 Bradycardia, unspecified: Secondary | ICD-10-CM | POA: Diagnosis not present

## 2022-01-28 DIAGNOSIS — R55 Syncope and collapse: Secondary | ICD-10-CM | POA: Diagnosis not present

## 2022-01-28 DIAGNOSIS — E860 Dehydration: Secondary | ICD-10-CM | POA: Insufficient documentation

## 2022-01-28 DIAGNOSIS — W268XXA Contact with other sharp object(s), not elsewhere classified, initial encounter: Secondary | ICD-10-CM | POA: Insufficient documentation

## 2022-01-28 DIAGNOSIS — S61012A Laceration without foreign body of left thumb without damage to nail, initial encounter: Secondary | ICD-10-CM | POA: Diagnosis not present

## 2022-01-28 DIAGNOSIS — Z23 Encounter for immunization: Secondary | ICD-10-CM | POA: Insufficient documentation

## 2022-01-28 DIAGNOSIS — Y990 Civilian activity done for income or pay: Secondary | ICD-10-CM | POA: Insufficient documentation

## 2022-01-28 DIAGNOSIS — S6992XA Unspecified injury of left wrist, hand and finger(s), initial encounter: Secondary | ICD-10-CM | POA: Diagnosis present

## 2022-01-28 LAB — CBC WITH DIFFERENTIAL/PLATELET
Abs Immature Granulocytes: 0.06 10*3/uL (ref 0.00–0.07)
Basophils Absolute: 0.1 10*3/uL (ref 0.0–0.1)
Basophils Relative: 1 %
Eosinophils Absolute: 0.1 10*3/uL (ref 0.0–0.5)
Eosinophils Relative: 1 %
HCT: 42.5 % (ref 39.0–52.0)
Hemoglobin: 14.2 g/dL (ref 13.0–17.0)
Immature Granulocytes: 1 %
Lymphocytes Relative: 28 %
Lymphs Abs: 2.9 10*3/uL (ref 0.7–4.0)
MCH: 29.3 pg (ref 26.0–34.0)
MCHC: 33.4 g/dL (ref 30.0–36.0)
MCV: 87.8 fL (ref 80.0–100.0)
Monocytes Absolute: 0.8 10*3/uL (ref 0.1–1.0)
Monocytes Relative: 8 %
Neutro Abs: 6.7 10*3/uL (ref 1.7–7.7)
Neutrophils Relative %: 61 %
Platelets: 212 10*3/uL (ref 150–400)
RBC: 4.84 MIL/uL (ref 4.22–5.81)
RDW: 13 % (ref 11.5–15.5)
WBC: 10.6 10*3/uL — ABNORMAL HIGH (ref 4.0–10.5)
nRBC: 0 % (ref 0.0–0.2)

## 2022-01-28 LAB — BASIC METABOLIC PANEL
Anion gap: 8 (ref 5–15)
BUN: 19 mg/dL (ref 8–23)
CO2: 23 mmol/L (ref 22–32)
Calcium: 9.3 mg/dL (ref 8.9–10.3)
Chloride: 111 mmol/L (ref 98–111)
Creatinine, Ser: 1.62 mg/dL — ABNORMAL HIGH (ref 0.61–1.24)
GFR, Estimated: 47 mL/min — ABNORMAL LOW (ref >60)
Glucose, Bld: 103 mg/dL — ABNORMAL HIGH (ref 70–99)
Potassium: 3.9 mmol/L (ref 3.5–5.1)
Sodium: 142 mmol/L (ref 135–145)

## 2022-01-28 LAB — CBG MONITORING, ED: Glucose-Capillary: 94 mg/dL (ref 70–99)

## 2022-01-28 MED ORDER — CEFADROXIL 500 MG PO CAPS: 500.0000 mg | ORAL_CAPSULE | Freq: Two times a day (BID) | ORAL | 0 refills | Status: AC

## 2022-01-28 MED ORDER — ONDANSETRON 4 MG PO TBDP: 4.0000 mg | ORAL_TABLET | Freq: Once | ORAL | Status: DC

## 2022-01-28 MED ORDER — ONDANSETRON HCL 4 MG/2ML IJ SOLN
4.0000 mg | INTRAMUSCULAR | Status: AC
  Administered 2022-01-28: 4 mg via INTRAVENOUS
  Filled 2022-01-28: qty 2

## 2022-01-28 MED ORDER — OXYCODONE-ACETAMINOPHEN 5-325 MG PO TABS: 2.0000 | ORAL_TABLET | Freq: Four times a day (QID) | ORAL | 0 refills | Status: AC | PRN

## 2022-01-28 MED ORDER — TETANUS-DIPHTH-ACELL PERTUSSIS 5-2.5-18.5 LF-MCG/0.5 IM SUSY
0.5000 mL | PREFILLED_SYRINGE | Freq: Once | INTRAMUSCULAR | Status: AC
  Administered 2022-01-28: 0.5 mL via INTRAMUSCULAR
  Filled 2022-01-28: qty 0.5

## 2022-01-28 MED ORDER — LACTATED RINGERS IV BOLUS
1000.0000 mL | Freq: Once | INTRAVENOUS | Status: AC
  Administered 2022-01-28: 1000 mL via INTRAVENOUS

## 2022-01-28 MED ORDER — CEPHALEXIN 500 MG PO CAPS
500.0000 mg | ORAL_CAPSULE | Freq: Once | ORAL | Status: AC
  Administered 2022-01-28: 500 mg via ORAL
  Filled 2022-01-28: qty 1

## 2022-01-28 MED ORDER — OXYCODONE-ACETAMINOPHEN 5-325 MG PO TABS
2.0000 | ORAL_TABLET | Freq: Once | ORAL | Status: AC
  Administered 2022-01-28: 2 via ORAL
  Filled 2022-01-28: qty 2

## 2022-01-28 NOTE — ED Triage Notes (Signed)
Patient states he cut his thumb at work. Patient was picking up some materials and cut his thumb with the chain. Last tetanus unknown.

## 2022-01-28 NOTE — ED Provider Notes (Signed)
[facility] Provider Note    Event Date/Time   First MD Initiated Contact with Patient 01/28/22 0144     (approximate)   History   Extremity Laceration   HPI  Thomas Wiley is a 66 y.o. male who presents for evaluation of left thumb injury while at work.  He states that he was working with some heavy equipment and somehow got his thumb up next to a fast-moving chain which ripped off the pad of his thumb on his nondominant hand (left hand).  He did not sustain any other injury.  He is uncertain of the date of his last tetanus vaccination.  The pain was immediate but the bleeding is relatively minimal.  He does not take any blood thinners.  He is able to flex and extend the thumb and the wound seems to be isolated to the distal phalanx pad.     Physical Exam   Triage Vital Signs: ED Triage Vitals  Enc Vitals Group     BP 01/28/22 0147 (!) 142/89     Pulse Rate 01/28/22 0144 (!) 55     Resp 01/28/22 0144 17     Temp 01/28/22 0144 98 F (36.7 C)     Temp Source 01/28/22 0144 Oral     SpO2 01/28/22 0147 98 %     Weight 01/28/22 0141 81.6 kg (180 lb)     Height 01/28/22 0141 1.676 m ('5\' 6"'$ )     Head Circumference --      Peak Flow --      Pain Score 01/28/22 0142 8     Pain Loc --      Pain Edu? --      Excl. in [place]? --     Most recent vital signs: Vitals:   01/28/22 0600 01/28/22 0630  BP: 100/63 (!) 115/59  Pulse: (!) 51 (!) 50  Resp: 13 14  Temp:    SpO2: 94% 95%     General: Awake, no distress.  CV:  Good peripheral perfusion.  Bradycardic at baseline, asymptomatic. Resp:  Normal effort.  Abd:  No distention.  Other:  Most of the pad of his left thumb is gone.  No exposed bone.  Minimal blood leakage.  There is a little bit of darkening beneath his thumbnail but without any nail damage.  The skin around his thumb wound is dirty but there is no obvious contamination to the tissue itself.   ED Results / Procedures / Treatments    Labs (all labs ordered are listed, but only abnormal results are displayed) Labs Reviewed  CBC WITH DIFFERENTIAL/PLATELET - Abnormal; Notable for the following components:      Result Value   WBC 10.6 (*)    All other components within normal limits  BASIC METABOLIC PANEL - Abnormal; Notable for the following components:   Glucose, Bld 103 (*)    Creatinine, Ser 1.62 (*)    GFR, Estimated 47 (*)    All other components within normal limits  CBG MONITORING, ED     EKG  ED ECG REPORT I, Hinda Kehr, the attending physician, personally viewed and interpreted this ECG.  Date: 01/28/2022 EKG Time: 3:25 AM Rate: 47 Rhythm: Sinus bradycardia QRS Axis: normal Intervals: normal ST/T Wave abnormalities: Non-specific ST segment / T-wave changes, but no clear evidence of acute ischemia. Narrative Interpretation: no definitive evidence of acute ischemia; does not meet STEMI criteria.    RADIOLOGY I viewed and interpreted the patient's thumb x-rays.  No evidence of bony injury, just soft tissue damage.  Radiology report agrees.    PROCEDURES:  Critical Care performed: No  Procedures   MEDICATIONS ORDERED IN ED: Medications  Tdap (BOOSTRIX) injection 0.5 mL (0.5 mLs Intramuscular Given 01/28/22 0147)  cephALEXin (KEFLEX) capsule 500 mg (500 mg Oral Given 01/28/22 0228)  oxyCODONE-acetaminophen (PERCOCET/ROXICET) 5-325 MG per tablet 2 tablet (2 tablets Oral Given 01/28/22 0228)  lactated ringers bolus 1,000 mL (0 mLs Intravenous Stopped 01/28/22 0636)  lactated ringers bolus 1,000 mL (0 mLs Intravenous Stopped 01/28/22 0636)  ondansetron (ZOFRAN) injection 4 mg (4 mg Intravenous Given 01/28/22 0402)     IMPRESSION / MDM / ASSESSMENT AND PLAN / ED COURSE  I reviewed the triage vital signs and the nursing notes.                              Differential diagnosis includes, but is not limited to, laceration, bone injury that results in an open fracture, fracture or  dislocation, subungual hematoma.  Patient's presentation is most consistent with acute complicated illness / injury requiring diagnostic workup.  Patient has a large soft tissue defect to the pad of the left thumb with most of the tissue missing.  However there is no exposed bone and the bleeding is minimal.  No obvious damage to the nail.  There is some darkening beneath the nail which could represent a subungual hematoma.  I offered to trephinate the nail but the patient prefers that I not do so.  He said he has done so to himself multiple times over the years and he will do it if needed, but he does not want to do it now.  I obtained x-rays of the thumb as documented above and there is no evidence of bony injury.  I explained to the patient that there is no way to close the wound given the complete lack of tissue.  I ordered Tdap and Keflex 500 mg p.o. for antibiotic prophylaxis.  I ordered 2 Percocet for his pain.  The wound was thoroughly cleaned and at my request the nurse placed a layer of Surgicel followed by Xeroform and a pressure dressing, tight only to the point of being comfortable and allowing for appropriate blood flow.  Patient understands and agrees with the need for orthopedic follow-up.  I checked the [place] controlled substance database and he has no concerning prescribing patterns, so I will write a short course of Percocet.   Clinical Course as of 01/28/22 6387  [facility] Jan 28, 2022  0417 (Delayed documentation) the patient was discharged and was preparing to leave when he became quite nauseated after the wound care on his thumb and after receiving the Percocet.  A Zofran 4 mg ODT was ordered but before he got it he became increasingly pale and diaphoretic and then nearly passed out.  He was brought back to a room in the ED and was found to be bradycardic and briefly hypotensive before his blood pressure returned to normal.  He was a little bit disoriented when I saw him but  was starting to improve.  He denied chest pain and shortness of breath.  We establish peripheral IV access and I ordered a BMP and CBC and LR 1 L IV bolus.  Labs are notable for a mild AKI and the patient, now completely back at his baseline, says that he has not been drinking very much recently and has been  working a lot.  He still feels little bit nauseated when he moves his head too quickly so I ordered Zofran 4 mg IV (he never received ODT).  We will monitor but I think that this was a vasovagal episode in the setting of mild dehydration, acute pain and wound care to his left thumb, and 2 Percocet. [CF]  [phone] He also reports that he has a low resting heart rate in the 40s and that occasionally he drops down lower.  This is well-known to him because he used to be a long-distance runner.  He said that in fact his heart rate used to get down in the 20s when he was asleep but typically he is in the 40s to 50s.  It is notable that his heart rate was 55 when he initially checked in for his thumb injury, when he was in acute pain and walking normally prior to any other onset of symptoms. [CF]  [phone] The patient has completed 2 L of fluid and got some sleep.  He said he feels much better.  He can now sit up and move around without becoming nauseated.  He said he is good to go.  No indication for any additional prescriptions.  I gave my usual and return precautions. [CF]    Clinical Course User Index [CF] Hinda Kehr, MD     FINAL CLINICAL IMPRESSION(S) / ED DIAGNOSES   Final diagnoses:  Laceration of left thumb without foreign body without damage to nail, initial encounter  Vasovagal episode  Dehydration     Rx / DC Orders   ED Discharge Orders          Ordered    oxyCODONE-acetaminophen (PERCOCET) 5-325 MG tablet  Every 6 hours PRN        01/28/22 0304    cefadroxil (DURICEF) 500 MG capsule  2 times daily        01/28/22 0304             Note:  This document was prepared using  Dragon voice recognition software and may include unintentional dictation errors.   Hinda Kehr, MD 01/28/22 [phone]

## 2022-01-28 NOTE — ED Notes (Signed)
This tech was called to triage to perform WC UDS on pt d/t cutting his thumb at work. This tech informed pt that we did not perform UDS for WC any longer and that we now do swabs under the tongue. Pt called [place] at [phone] and let this tech speak with her. This tech informed Lorna Few that we did not perform the UDS for WC any longer and I explained to her what we did instead. She stated to me, "just treat him like you normally would and the safety person will follow up with him and we can go from her." An ineligibility form was filled out and given to pt and pt was instructed that it was to be given to his supervisor. Pt verbalized understanding.

## 2022-01-28 NOTE — ED Notes (Signed)
ED Provider at bedside. 

## 2022-01-28 NOTE — Discharge Instructions (Addendum)
You have been seen in the Emergency Department (ED) today for an injury to your thumb.  As we discussed, there is no tissue to sew the wound shut.  It is going to have to heal on its own.  Please try to keep it clean and dry, changing the dressing at least once if not twice a day.  You can clean it with soap and water but the very gentle while the skin is starting to heal back.  Take the prescribed antibiotics for the full course of treatment.   Please take Tylenol (acetaminophen) or Motrin (ibuprofen) as needed for discomfort as written on the box. Take Percocet as prescribed for severe pain. Do not drink alcohol, drive or participate in any other potentially dangerous activities while taking this medication as it may make you sleepy. Do not take this medication with any other sedating medications, either prescription or over-the-counter. If you were prescribed Percocet or Vicodin, do not take these with acetaminophen (Tylenol) as it is already contained within these medications.   This medication is an opiate (or narcotic) pain medication and can be habit forming.  Use it as little as possible to achieve adequate pain control.  Do not use or use it with extreme caution if you have a history of opiate abuse or dependence.  If you are on a pain contract with your primary care doctor or a pain specialist, be sure to let them know you were prescribed this medication today from the [facility] Emergency Department.  This medication is intended for your use only - do not give any to anyone else and keep it in a secure place where nobody else, especially children, have access to it.  It will also cause or worsen constipation, so you may want to consider taking an over-the-counter stool softener while you are taking this medication.   Please follow up with your doctor as soon as possible regarding today's emergent visit.  We strongly encourage you to call the office of Dr. Roland Rack and schedule a follow-up visit  with him or one of his colleagues in the orthopedics office.  Return to the ED or call your doctor if you notice any signs of infection such as fever, increased pain, increased redness, pus, or other symptoms that concern you.

## 2022-01-29 ENCOUNTER — Ambulatory Visit: Payer: Self-pay

## 2022-01-29 NOTE — Telephone Encounter (Signed)
  Chief Complaint: bandaging questions Symptoms:  Frequency: yesterday Pertinent Negatives: Patient denies pain, infection Disposition: '[]'$ ED /'[x]'$ Urgent Care (no appt availability in office) / '[]'$ Appointment(In office/virtual)/ '[]'$  [facility]/ '[]'$ Home Care/ '[]'$ Refused Recommended Disposition /'[]'$  Mobile Bus/ '[x]'$  Follow-up with PCP Additional Notes: Pt was seen at ed yesterday for a wound on his thumb. Pt was told to replace bandage everyday. Pt has questions. PT to follow up with UC or PCP for help.    Reason for Disposition  [1] Caller requesting NON-URGENT health information AND [2] PCP's office is the best resource  Answer Assessment - Initial Assessment Questions 1. REASON FOR CALL or QUESTION: "What is your reason for calling today?" or "How can I best help you?" or "What question do you have that I can help answer?"     PT wants information on bandaging  a wound from yesterday.  Protocols used: Information Only Call - No Triage-A-AH

## 2023-11-12 ENCOUNTER — Ambulatory Visit
Admission: EM | Admit: 2023-11-12 | Discharge: 2023-11-12 | Disposition: A | Discharge status: Home / Self Care | Attending: Emergency Medicine | Admitting: Emergency Medicine

## 2023-11-12 ENCOUNTER — Encounter: Payer: Self-pay | Admitting: Emergency Medicine

## 2023-11-12 DIAGNOSIS — L03011 Cellulitis of right finger: Secondary | ICD-10-CM

## 2023-11-12 MED ORDER — DOXYCYCLINE HYCLATE 100 MG PO CAPS: 100.0000 mg | ORAL_CAPSULE | Freq: Two times a day (BID) | ORAL | 0 refills | Status: AC

## 2023-11-12 NOTE — Discharge Instructions (Addendum)
Take the doxycycline as directed.    Follow up with your primary care provider if your symptoms are not improving.    

## 2023-11-12 NOTE — ED Triage Notes (Signed)
 Patient in office today 4days right middle finger swelling with discoloration  injured it with box cutter.  OTC: none  Denies: none

## 2023-11-12 NOTE — ED Provider Notes (Signed)
 Thomas Wiley    CSN: [id number] Arrival date & time: 11/12/23  0807      History   Chief Complaint Chief Complaint  Patient presents with   Wound Check    HPI Thomas Wiley is a 68 y.o. male.  Patient presents with 4-day history of pain, swelling, redness of his right middle finger beside his fingernail.  No trauma.  He states there was a pus pocket which he cut open at home a couple of days ago with return of yellow-green malodorous purulent drainage.  Since he drained it at home, he reports decreased swelling but ongoing redness and discomfort.  No fever or chills.  Last tetanus 2023.    The history is provided by the patient and medical records.    Past Medical History:  Diagnosis Date   Cancer (HCC)    colon, lung    There are no active problems to display for this patient.   History reviewed. No pertinent surgical history.     Home Medications    Prior to Admission medications   Medication Sig Start Date End Date Taking? Authorizing Provider  doxycycline  (VIBRAMYCIN ) 100 MG capsule Take 1 capsule (100 mg total) by mouth 2 (two) times daily for 7 days. 11/12/23 11/19/23 Yes Corlis Burnard DEL, NP  oxyCODONE -acetaminophen  (PERCOCET) 5-325 MG tablet Take 2 tablets by mouth every 6 (six) hours as needed for severe pain. 01/28/22   Gordan Huxley, MD    Family History History reviewed. No pertinent family history.  Social History Social History   Tobacco Use   Smoking status: Former   Smokeless tobacco: Never  Substance Use Topics   Alcohol use: Yes    Comment: occ   Drug use: Never     Allergies   Cymbalta [duloxetine hcl]   Review of Systems Review of Systems  Constitutional:  Negative for chills and fever.  Musculoskeletal:  Negative for arthralgias and joint swelling.  Skin:  Positive for color change and wound.  Neurological:  Negative for weakness and numbness.     Physical Exam Triage Vital Signs ED Triage Vitals  Encounter Vitals  Group     BP      Girls Systolic BP Percentile      Girls Diastolic BP Percentile      Boys Systolic BP Percentile      Boys Diastolic BP Percentile      Pulse      Resp      Temp      Temp src      SpO2      Weight      Height      Head Circumference      Peak Flow      Pain Score      Pain Loc      Pain Education      Exclude from Growth Chart    No data found.  Updated Vital Signs BP (!) 153/80   Pulse (!) 50   Temp 97.9 F (36.6 C) (Oral)   Resp 16   Ht 5' 6 (1.676 m)   Wt 180 lb (81.6 kg)   SpO2 96%   BMI 29.05 kg/m   Visual Acuity Right Eye Distance:   Left Eye Distance:   Bilateral Distance:    Right Eye Near:   Left Eye Near:    Bilateral Near:     Physical Exam Constitutional:      General: He is not in acute distress. HENT:  Mouth/Throat:     Mouth: Mucous membranes are moist.  Cardiovascular:     Rate and Rhythm: Regular rhythm. Bradycardia present.  Pulmonary:     Effort: Pulmonary effort is normal. No respiratory distress.  Musculoskeletal:        General: Swelling present. No tenderness or deformity. Normal range of motion.  Skin:    General: Skin is warm and dry.     Capillary Refill: Capillary refill takes less than 2 seconds.     Findings: Erythema present.     Comments: Paronychia of right middle finger.  No indication of felon.  Needle aspiration with return of scant bloody drainage only.  Neurological:     General: No focal deficit present.     Mental Status: He is alert.     Sensory: No sensory deficit.     Motor: No weakness.      UC Treatments / Results  Labs (all labs ordered are listed, but only abnormal results are displayed) Labs Reviewed - No data to display  EKG   Radiology No results found.  Procedures Procedures (including critical care time)  Medications Ordered in UC Medications - No data to display  Initial Impression / Assessment and Plan / UC Course  I have reviewed the triage vital signs  and the nursing notes.  Pertinent labs & imaging results that were available during my care of the patient were reviewed by me and considered in my medical decision making (see chart for details).    Paronychia of right middle finger.  No I&D indicated at this time.  Treating with doxycycline .  Tetanus is up-to-date.  Education provided on paronychia.  Wound care instructions and signs of worsening infection discussed.  Instructed patient to follow-up with his PCP if he is not improving.  He agrees to plan of care.  Final Clinical Impressions(s) / UC Diagnoses   Final diagnoses:  Paronychia of right middle finger     Discharge Instructions      Take the doxycycline  as directed.  Follow-up with your primary care provider if your symptoms are not improving.      ED Prescriptions     Medication Sig Dispense Auth. Provider   doxycycline  (VIBRAMYCIN ) 100 MG capsule Take 1 capsule (100 mg total) by mouth 2 (two) times daily for 7 days. 14 capsule Corlis Burnard DEL, NP      PDMP not reviewed this encounter.   Corlis Burnard DEL, NP 11/12/23 [phone]
